# Patient Record
Sex: Female | Born: 2000 | Race: White | Hispanic: No | Marital: Single | Smoking: Never smoker
Health system: Southern US, Community
[De-identification: ages and names within clinical notes are randomized; demographics above are authoritative.]

---
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
## 2019-01-04 DIAGNOSIS — M9904 Segmental and somatic dysfunction of sacral region: Secondary | ICD-10-CM | POA: Diagnosis not present

## 2019-01-04 DIAGNOSIS — M9905 Segmental and somatic dysfunction of pelvic region: Secondary | ICD-10-CM | POA: Diagnosis not present

## 2019-01-04 DIAGNOSIS — M9903 Segmental and somatic dysfunction of lumbar region: Secondary | ICD-10-CM | POA: Diagnosis not present

## 2019-01-04 DIAGNOSIS — M4120 Other idiopathic scoliosis, site unspecified: Secondary | ICD-10-CM | POA: Diagnosis not present

## 2019-01-11 DIAGNOSIS — M4120 Other idiopathic scoliosis, site unspecified: Secondary | ICD-10-CM | POA: Diagnosis not present

## 2019-01-11 DIAGNOSIS — M9905 Segmental and somatic dysfunction of pelvic region: Secondary | ICD-10-CM | POA: Diagnosis not present

## 2019-01-11 DIAGNOSIS — M9904 Segmental and somatic dysfunction of sacral region: Secondary | ICD-10-CM | POA: Diagnosis not present

## 2019-01-11 DIAGNOSIS — M9903 Segmental and somatic dysfunction of lumbar region: Secondary | ICD-10-CM | POA: Diagnosis not present

## 2019-02-17 DIAGNOSIS — L700 Acne vulgaris: Secondary | ICD-10-CM | POA: Diagnosis not present

## 2019-02-17 DIAGNOSIS — L738 Other specified follicular disorders: Secondary | ICD-10-CM | POA: Diagnosis not present

## 2019-02-17 DIAGNOSIS — L219 Seborrheic dermatitis, unspecified: Secondary | ICD-10-CM | POA: Diagnosis not present

## 2019-03-03 DIAGNOSIS — M545 Low back pain: Secondary | ICD-10-CM | POA: Diagnosis not present

## 2019-03-03 DIAGNOSIS — M4186 Other forms of scoliosis, lumbar region: Secondary | ICD-10-CM | POA: Diagnosis not present

## 2019-03-03 DIAGNOSIS — M9902 Segmental and somatic dysfunction of thoracic region: Secondary | ICD-10-CM | POA: Diagnosis not present

## 2019-03-03 DIAGNOSIS — M546 Pain in thoracic spine: Secondary | ICD-10-CM | POA: Diagnosis not present

## 2019-03-03 DIAGNOSIS — M461 Sacroiliitis, not elsewhere classified: Secondary | ICD-10-CM | POA: Diagnosis not present

## 2019-03-03 DIAGNOSIS — M542 Cervicalgia: Secondary | ICD-10-CM | POA: Diagnosis not present

## 2019-03-06 DIAGNOSIS — M461 Sacroiliitis, not elsewhere classified: Secondary | ICD-10-CM | POA: Diagnosis not present

## 2019-03-06 DIAGNOSIS — M542 Cervicalgia: Secondary | ICD-10-CM | POA: Diagnosis not present

## 2019-03-06 DIAGNOSIS — M545 Low back pain: Secondary | ICD-10-CM | POA: Diagnosis not present

## 2019-03-06 DIAGNOSIS — M9902 Segmental and somatic dysfunction of thoracic region: Secondary | ICD-10-CM | POA: Diagnosis not present

## 2019-03-06 DIAGNOSIS — M546 Pain in thoracic spine: Secondary | ICD-10-CM | POA: Diagnosis not present

## 2019-03-06 DIAGNOSIS — M4186 Other forms of scoliosis, lumbar region: Secondary | ICD-10-CM | POA: Diagnosis not present

## 2019-03-07 DIAGNOSIS — M4186 Other forms of scoliosis, lumbar region: Secondary | ICD-10-CM | POA: Diagnosis not present

## 2019-03-07 DIAGNOSIS — M546 Pain in thoracic spine: Secondary | ICD-10-CM | POA: Diagnosis not present

## 2019-03-07 DIAGNOSIS — M542 Cervicalgia: Secondary | ICD-10-CM | POA: Diagnosis not present

## 2019-03-07 DIAGNOSIS — M461 Sacroiliitis, not elsewhere classified: Secondary | ICD-10-CM | POA: Diagnosis not present

## 2019-03-07 DIAGNOSIS — M9902 Segmental and somatic dysfunction of thoracic region: Secondary | ICD-10-CM | POA: Diagnosis not present

## 2019-03-07 DIAGNOSIS — M545 Low back pain: Secondary | ICD-10-CM | POA: Diagnosis not present

## 2019-03-08 DIAGNOSIS — M9902 Segmental and somatic dysfunction of thoracic region: Secondary | ICD-10-CM | POA: Diagnosis not present

## 2019-03-08 DIAGNOSIS — M4186 Other forms of scoliosis, lumbar region: Secondary | ICD-10-CM | POA: Diagnosis not present

## 2019-03-08 DIAGNOSIS — M546 Pain in thoracic spine: Secondary | ICD-10-CM | POA: Diagnosis not present

## 2019-03-08 DIAGNOSIS — M545 Low back pain: Secondary | ICD-10-CM | POA: Diagnosis not present

## 2019-03-08 DIAGNOSIS — M542 Cervicalgia: Secondary | ICD-10-CM | POA: Diagnosis not present

## 2019-03-08 DIAGNOSIS — M461 Sacroiliitis, not elsewhere classified: Secondary | ICD-10-CM | POA: Diagnosis not present

## 2019-03-13 DIAGNOSIS — M461 Sacroiliitis, not elsewhere classified: Secondary | ICD-10-CM | POA: Diagnosis not present

## 2019-03-13 DIAGNOSIS — M542 Cervicalgia: Secondary | ICD-10-CM | POA: Diagnosis not present

## 2019-03-13 DIAGNOSIS — M546 Pain in thoracic spine: Secondary | ICD-10-CM | POA: Diagnosis not present

## 2019-03-13 DIAGNOSIS — M9902 Segmental and somatic dysfunction of thoracic region: Secondary | ICD-10-CM | POA: Diagnosis not present

## 2019-03-13 DIAGNOSIS — M545 Low back pain: Secondary | ICD-10-CM | POA: Diagnosis not present

## 2019-03-13 DIAGNOSIS — M4186 Other forms of scoliosis, lumbar region: Secondary | ICD-10-CM | POA: Diagnosis not present

## 2019-03-14 DIAGNOSIS — R5383 Other fatigue: Secondary | ICD-10-CM | POA: Diagnosis not present

## 2019-03-14 DIAGNOSIS — U071 COVID-19: Secondary | ICD-10-CM | POA: Diagnosis not present

## 2019-03-20 DIAGNOSIS — M546 Pain in thoracic spine: Secondary | ICD-10-CM | POA: Diagnosis not present

## 2019-03-20 DIAGNOSIS — M545 Low back pain: Secondary | ICD-10-CM | POA: Diagnosis not present

## 2019-03-20 DIAGNOSIS — M4186 Other forms of scoliosis, lumbar region: Secondary | ICD-10-CM | POA: Diagnosis not present

## 2019-03-20 DIAGNOSIS — M461 Sacroiliitis, not elsewhere classified: Secondary | ICD-10-CM | POA: Diagnosis not present

## 2019-03-20 DIAGNOSIS — M542 Cervicalgia: Secondary | ICD-10-CM | POA: Diagnosis not present

## 2019-03-20 DIAGNOSIS — M9902 Segmental and somatic dysfunction of thoracic region: Secondary | ICD-10-CM | POA: Diagnosis not present

## 2019-03-21 DIAGNOSIS — M4186 Other forms of scoliosis, lumbar region: Secondary | ICD-10-CM | POA: Diagnosis not present

## 2019-03-21 DIAGNOSIS — M461 Sacroiliitis, not elsewhere classified: Secondary | ICD-10-CM | POA: Diagnosis not present

## 2019-03-21 DIAGNOSIS — M545 Low back pain: Secondary | ICD-10-CM | POA: Diagnosis not present

## 2019-03-21 DIAGNOSIS — M542 Cervicalgia: Secondary | ICD-10-CM | POA: Diagnosis not present

## 2019-03-21 DIAGNOSIS — M546 Pain in thoracic spine: Secondary | ICD-10-CM | POA: Diagnosis not present

## 2019-03-21 DIAGNOSIS — M9902 Segmental and somatic dysfunction of thoracic region: Secondary | ICD-10-CM | POA: Diagnosis not present

## 2019-03-22 DIAGNOSIS — M542 Cervicalgia: Secondary | ICD-10-CM | POA: Diagnosis not present

## 2019-03-22 DIAGNOSIS — M4186 Other forms of scoliosis, lumbar region: Secondary | ICD-10-CM | POA: Diagnosis not present

## 2019-03-22 DIAGNOSIS — M9902 Segmental and somatic dysfunction of thoracic region: Secondary | ICD-10-CM | POA: Diagnosis not present

## 2019-03-22 DIAGNOSIS — M546 Pain in thoracic spine: Secondary | ICD-10-CM | POA: Diagnosis not present

## 2019-03-22 DIAGNOSIS — M461 Sacroiliitis, not elsewhere classified: Secondary | ICD-10-CM | POA: Diagnosis not present

## 2019-03-22 DIAGNOSIS — M545 Low back pain: Secondary | ICD-10-CM | POA: Diagnosis not present

## 2019-03-27 DIAGNOSIS — M546 Pain in thoracic spine: Secondary | ICD-10-CM | POA: Diagnosis not present

## 2019-03-27 DIAGNOSIS — M461 Sacroiliitis, not elsewhere classified: Secondary | ICD-10-CM | POA: Diagnosis not present

## 2019-03-27 DIAGNOSIS — M4186 Other forms of scoliosis, lumbar region: Secondary | ICD-10-CM | POA: Diagnosis not present

## 2019-03-27 DIAGNOSIS — M542 Cervicalgia: Secondary | ICD-10-CM | POA: Diagnosis not present

## 2019-03-27 DIAGNOSIS — M545 Low back pain: Secondary | ICD-10-CM | POA: Diagnosis not present

## 2019-03-27 DIAGNOSIS — M9902 Segmental and somatic dysfunction of thoracic region: Secondary | ICD-10-CM | POA: Diagnosis not present

## 2019-03-28 DIAGNOSIS — M546 Pain in thoracic spine: Secondary | ICD-10-CM | POA: Diagnosis not present

## 2019-03-28 DIAGNOSIS — M9902 Segmental and somatic dysfunction of thoracic region: Secondary | ICD-10-CM | POA: Diagnosis not present

## 2019-03-28 DIAGNOSIS — M4186 Other forms of scoliosis, lumbar region: Secondary | ICD-10-CM | POA: Diagnosis not present

## 2019-03-28 DIAGNOSIS — M545 Low back pain: Secondary | ICD-10-CM | POA: Diagnosis not present

## 2019-03-28 DIAGNOSIS — M461 Sacroiliitis, not elsewhere classified: Secondary | ICD-10-CM | POA: Diagnosis not present

## 2019-03-28 DIAGNOSIS — M542 Cervicalgia: Secondary | ICD-10-CM | POA: Diagnosis not present

## 2019-04-03 DIAGNOSIS — M542 Cervicalgia: Secondary | ICD-10-CM | POA: Diagnosis not present

## 2019-04-03 DIAGNOSIS — M9902 Segmental and somatic dysfunction of thoracic region: Secondary | ICD-10-CM | POA: Diagnosis not present

## 2019-04-03 DIAGNOSIS — M545 Low back pain: Secondary | ICD-10-CM | POA: Diagnosis not present

## 2019-04-03 DIAGNOSIS — M546 Pain in thoracic spine: Secondary | ICD-10-CM | POA: Diagnosis not present

## 2019-04-03 DIAGNOSIS — M4186 Other forms of scoliosis, lumbar region: Secondary | ICD-10-CM | POA: Diagnosis not present

## 2019-04-03 DIAGNOSIS — M461 Sacroiliitis, not elsewhere classified: Secondary | ICD-10-CM | POA: Diagnosis not present

## 2019-04-04 DIAGNOSIS — M9902 Segmental and somatic dysfunction of thoracic region: Secondary | ICD-10-CM | POA: Diagnosis not present

## 2019-04-04 DIAGNOSIS — M4186 Other forms of scoliosis, lumbar region: Secondary | ICD-10-CM | POA: Diagnosis not present

## 2019-04-04 DIAGNOSIS — M546 Pain in thoracic spine: Secondary | ICD-10-CM | POA: Diagnosis not present

## 2019-04-04 DIAGNOSIS — M542 Cervicalgia: Secondary | ICD-10-CM | POA: Diagnosis not present

## 2019-04-04 DIAGNOSIS — M545 Low back pain: Secondary | ICD-10-CM | POA: Diagnosis not present

## 2019-04-04 DIAGNOSIS — M461 Sacroiliitis, not elsewhere classified: Secondary | ICD-10-CM | POA: Diagnosis not present

## 2019-04-05 DIAGNOSIS — M545 Low back pain: Secondary | ICD-10-CM | POA: Diagnosis not present

## 2019-04-05 DIAGNOSIS — M9902 Segmental and somatic dysfunction of thoracic region: Secondary | ICD-10-CM | POA: Diagnosis not present

## 2019-04-05 DIAGNOSIS — M542 Cervicalgia: Secondary | ICD-10-CM | POA: Diagnosis not present

## 2019-04-05 DIAGNOSIS — M461 Sacroiliitis, not elsewhere classified: Secondary | ICD-10-CM | POA: Diagnosis not present

## 2019-04-05 DIAGNOSIS — M4186 Other forms of scoliosis, lumbar region: Secondary | ICD-10-CM | POA: Diagnosis not present

## 2019-04-05 DIAGNOSIS — M546 Pain in thoracic spine: Secondary | ICD-10-CM | POA: Diagnosis not present

## 2019-04-10 DIAGNOSIS — M4186 Other forms of scoliosis, lumbar region: Secondary | ICD-10-CM | POA: Diagnosis not present

## 2019-04-10 DIAGNOSIS — M546 Pain in thoracic spine: Secondary | ICD-10-CM | POA: Diagnosis not present

## 2019-04-10 DIAGNOSIS — M545 Low back pain: Secondary | ICD-10-CM | POA: Diagnosis not present

## 2019-04-10 DIAGNOSIS — M461 Sacroiliitis, not elsewhere classified: Secondary | ICD-10-CM | POA: Diagnosis not present

## 2019-04-10 DIAGNOSIS — M9902 Segmental and somatic dysfunction of thoracic region: Secondary | ICD-10-CM | POA: Diagnosis not present

## 2019-04-10 DIAGNOSIS — M542 Cervicalgia: Secondary | ICD-10-CM | POA: Diagnosis not present

## 2019-04-11 DIAGNOSIS — M545 Low back pain: Secondary | ICD-10-CM | POA: Diagnosis not present

## 2019-04-11 DIAGNOSIS — M461 Sacroiliitis, not elsewhere classified: Secondary | ICD-10-CM | POA: Diagnosis not present

## 2019-04-11 DIAGNOSIS — M4186 Other forms of scoliosis, lumbar region: Secondary | ICD-10-CM | POA: Diagnosis not present

## 2019-04-11 DIAGNOSIS — M9902 Segmental and somatic dysfunction of thoracic region: Secondary | ICD-10-CM | POA: Diagnosis not present

## 2019-04-11 DIAGNOSIS — M546 Pain in thoracic spine: Secondary | ICD-10-CM | POA: Diagnosis not present

## 2019-04-11 DIAGNOSIS — M542 Cervicalgia: Secondary | ICD-10-CM | POA: Diagnosis not present

## 2019-04-24 DIAGNOSIS — M4186 Other forms of scoliosis, lumbar region: Secondary | ICD-10-CM | POA: Diagnosis not present

## 2019-04-24 DIAGNOSIS — M542 Cervicalgia: Secondary | ICD-10-CM | POA: Diagnosis not present

## 2019-04-24 DIAGNOSIS — M461 Sacroiliitis, not elsewhere classified: Secondary | ICD-10-CM | POA: Diagnosis not present

## 2019-04-24 DIAGNOSIS — M545 Low back pain: Secondary | ICD-10-CM | POA: Diagnosis not present

## 2019-04-24 DIAGNOSIS — M9902 Segmental and somatic dysfunction of thoracic region: Secondary | ICD-10-CM | POA: Diagnosis not present

## 2019-04-24 DIAGNOSIS — M546 Pain in thoracic spine: Secondary | ICD-10-CM | POA: Diagnosis not present

## 2019-04-25 DIAGNOSIS — M542 Cervicalgia: Secondary | ICD-10-CM | POA: Diagnosis not present

## 2019-04-25 DIAGNOSIS — M9902 Segmental and somatic dysfunction of thoracic region: Secondary | ICD-10-CM | POA: Diagnosis not present

## 2019-04-25 DIAGNOSIS — M546 Pain in thoracic spine: Secondary | ICD-10-CM | POA: Diagnosis not present

## 2019-04-25 DIAGNOSIS — M461 Sacroiliitis, not elsewhere classified: Secondary | ICD-10-CM | POA: Diagnosis not present

## 2019-04-25 DIAGNOSIS — M545 Low back pain: Secondary | ICD-10-CM | POA: Diagnosis not present

## 2019-04-25 DIAGNOSIS — M4186 Other forms of scoliosis, lumbar region: Secondary | ICD-10-CM | POA: Diagnosis not present

## 2019-04-26 DIAGNOSIS — M545 Low back pain: Secondary | ICD-10-CM | POA: Diagnosis not present

## 2019-04-26 DIAGNOSIS — M546 Pain in thoracic spine: Secondary | ICD-10-CM | POA: Diagnosis not present

## 2019-04-26 DIAGNOSIS — M542 Cervicalgia: Secondary | ICD-10-CM | POA: Diagnosis not present

## 2019-04-26 DIAGNOSIS — M461 Sacroiliitis, not elsewhere classified: Secondary | ICD-10-CM | POA: Diagnosis not present

## 2019-04-26 DIAGNOSIS — M9902 Segmental and somatic dysfunction of thoracic region: Secondary | ICD-10-CM | POA: Diagnosis not present

## 2019-04-26 DIAGNOSIS — M4186 Other forms of scoliosis, lumbar region: Secondary | ICD-10-CM | POA: Diagnosis not present

## 2019-05-01 DIAGNOSIS — M542 Cervicalgia: Secondary | ICD-10-CM | POA: Diagnosis not present

## 2019-05-01 DIAGNOSIS — M9902 Segmental and somatic dysfunction of thoracic region: Secondary | ICD-10-CM | POA: Diagnosis not present

## 2019-05-01 DIAGNOSIS — M546 Pain in thoracic spine: Secondary | ICD-10-CM | POA: Diagnosis not present

## 2019-05-01 DIAGNOSIS — M545 Low back pain: Secondary | ICD-10-CM | POA: Diagnosis not present

## 2019-05-01 DIAGNOSIS — M461 Sacroiliitis, not elsewhere classified: Secondary | ICD-10-CM | POA: Diagnosis not present

## 2019-05-01 DIAGNOSIS — M4186 Other forms of scoliosis, lumbar region: Secondary | ICD-10-CM | POA: Diagnosis not present

## 2019-05-02 DIAGNOSIS — M4186 Other forms of scoliosis, lumbar region: Secondary | ICD-10-CM | POA: Diagnosis not present

## 2019-05-02 DIAGNOSIS — M542 Cervicalgia: Secondary | ICD-10-CM | POA: Diagnosis not present

## 2019-05-02 DIAGNOSIS — M546 Pain in thoracic spine: Secondary | ICD-10-CM | POA: Diagnosis not present

## 2019-05-02 DIAGNOSIS — M9902 Segmental and somatic dysfunction of thoracic region: Secondary | ICD-10-CM | POA: Diagnosis not present

## 2019-05-02 DIAGNOSIS — M545 Low back pain: Secondary | ICD-10-CM | POA: Diagnosis not present

## 2019-05-02 DIAGNOSIS — M461 Sacroiliitis, not elsewhere classified: Secondary | ICD-10-CM | POA: Diagnosis not present

## 2019-05-03 DIAGNOSIS — M546 Pain in thoracic spine: Secondary | ICD-10-CM | POA: Diagnosis not present

## 2019-05-03 DIAGNOSIS — M545 Low back pain: Secondary | ICD-10-CM | POA: Diagnosis not present

## 2019-05-03 DIAGNOSIS — M4186 Other forms of scoliosis, lumbar region: Secondary | ICD-10-CM | POA: Diagnosis not present

## 2019-05-03 DIAGNOSIS — M461 Sacroiliitis, not elsewhere classified: Secondary | ICD-10-CM | POA: Diagnosis not present

## 2019-05-03 DIAGNOSIS — M542 Cervicalgia: Secondary | ICD-10-CM | POA: Diagnosis not present

## 2019-05-03 DIAGNOSIS — M9902 Segmental and somatic dysfunction of thoracic region: Secondary | ICD-10-CM | POA: Diagnosis not present

## 2019-05-22 DIAGNOSIS — M542 Cervicalgia: Secondary | ICD-10-CM | POA: Diagnosis not present

## 2019-05-22 DIAGNOSIS — M461 Sacroiliitis, not elsewhere classified: Secondary | ICD-10-CM | POA: Diagnosis not present

## 2019-05-22 DIAGNOSIS — M546 Pain in thoracic spine: Secondary | ICD-10-CM | POA: Diagnosis not present

## 2019-05-22 DIAGNOSIS — M4186 Other forms of scoliosis, lumbar region: Secondary | ICD-10-CM | POA: Diagnosis not present

## 2019-05-22 DIAGNOSIS — M545 Low back pain: Secondary | ICD-10-CM | POA: Diagnosis not present

## 2019-05-22 DIAGNOSIS — M9902 Segmental and somatic dysfunction of thoracic region: Secondary | ICD-10-CM | POA: Diagnosis not present

## 2019-06-05 DIAGNOSIS — M546 Pain in thoracic spine: Secondary | ICD-10-CM | POA: Diagnosis not present

## 2019-06-05 DIAGNOSIS — M4186 Other forms of scoliosis, lumbar region: Secondary | ICD-10-CM | POA: Diagnosis not present

## 2019-06-05 DIAGNOSIS — M545 Low back pain: Secondary | ICD-10-CM | POA: Diagnosis not present

## 2019-06-05 DIAGNOSIS — M461 Sacroiliitis, not elsewhere classified: Secondary | ICD-10-CM | POA: Diagnosis not present

## 2019-06-05 DIAGNOSIS — M542 Cervicalgia: Secondary | ICD-10-CM | POA: Diagnosis not present

## 2019-06-05 DIAGNOSIS — M9902 Segmental and somatic dysfunction of thoracic region: Secondary | ICD-10-CM | POA: Diagnosis not present

## 2019-06-08 DIAGNOSIS — L700 Acne vulgaris: Secondary | ICD-10-CM | POA: Diagnosis not present

## 2019-06-08 DIAGNOSIS — L219 Seborrheic dermatitis, unspecified: Secondary | ICD-10-CM | POA: Diagnosis not present

## 2019-08-18 DIAGNOSIS — M67431 Ganglion, right wrist: Secondary | ICD-10-CM | POA: Diagnosis not present

## 2019-08-18 DIAGNOSIS — M25531 Pain in right wrist: Secondary | ICD-10-CM | POA: Diagnosis not present

## 2019-10-23 DIAGNOSIS — M25531 Pain in right wrist: Secondary | ICD-10-CM | POA: Diagnosis not present

## 2019-10-23 DIAGNOSIS — M67431 Ganglion, right wrist: Secondary | ICD-10-CM | POA: Diagnosis not present

## 2019-10-30 DIAGNOSIS — L814 Other melanin hyperpigmentation: Secondary | ICD-10-CM | POA: Diagnosis not present

## 2019-10-30 DIAGNOSIS — Z1283 Encounter for screening for malignant neoplasm of skin: Secondary | ICD-10-CM | POA: Diagnosis not present

## 2019-10-30 DIAGNOSIS — D229 Melanocytic nevi, unspecified: Secondary | ICD-10-CM | POA: Diagnosis not present

## 2019-10-30 DIAGNOSIS — L739 Follicular disorder, unspecified: Secondary | ICD-10-CM | POA: Diagnosis not present

## 2019-11-07 DIAGNOSIS — M4186 Other forms of scoliosis, lumbar region: Secondary | ICD-10-CM | POA: Diagnosis not present

## 2019-11-07 DIAGNOSIS — M9902 Segmental and somatic dysfunction of thoracic region: Secondary | ICD-10-CM | POA: Diagnosis not present

## 2019-11-07 DIAGNOSIS — M461 Sacroiliitis, not elsewhere classified: Secondary | ICD-10-CM | POA: Diagnosis not present

## 2019-11-07 DIAGNOSIS — M542 Cervicalgia: Secondary | ICD-10-CM | POA: Diagnosis not present

## 2019-11-07 DIAGNOSIS — M546 Pain in thoracic spine: Secondary | ICD-10-CM | POA: Diagnosis not present

## 2019-11-07 DIAGNOSIS — M545 Low back pain: Secondary | ICD-10-CM | POA: Diagnosis not present

## 2019-11-09 DIAGNOSIS — M4186 Other forms of scoliosis, lumbar region: Secondary | ICD-10-CM | POA: Diagnosis not present

## 2019-11-09 DIAGNOSIS — M461 Sacroiliitis, not elsewhere classified: Secondary | ICD-10-CM | POA: Diagnosis not present

## 2019-11-09 DIAGNOSIS — M545 Low back pain: Secondary | ICD-10-CM | POA: Diagnosis not present

## 2019-11-09 DIAGNOSIS — M9902 Segmental and somatic dysfunction of thoracic region: Secondary | ICD-10-CM | POA: Diagnosis not present

## 2019-11-09 DIAGNOSIS — M542 Cervicalgia: Secondary | ICD-10-CM | POA: Diagnosis not present

## 2019-11-09 DIAGNOSIS — M546 Pain in thoracic spine: Secondary | ICD-10-CM | POA: Diagnosis not present

## 2019-11-13 DIAGNOSIS — M545 Low back pain: Secondary | ICD-10-CM | POA: Diagnosis not present

## 2019-11-13 DIAGNOSIS — M9902 Segmental and somatic dysfunction of thoracic region: Secondary | ICD-10-CM | POA: Diagnosis not present

## 2019-11-13 DIAGNOSIS — M542 Cervicalgia: Secondary | ICD-10-CM | POA: Diagnosis not present

## 2019-11-13 DIAGNOSIS — M4186 Other forms of scoliosis, lumbar region: Secondary | ICD-10-CM | POA: Diagnosis not present

## 2019-11-13 DIAGNOSIS — M461 Sacroiliitis, not elsewhere classified: Secondary | ICD-10-CM | POA: Diagnosis not present

## 2019-11-13 DIAGNOSIS — M546 Pain in thoracic spine: Secondary | ICD-10-CM | POA: Diagnosis not present

## 2019-11-16 DIAGNOSIS — M9902 Segmental and somatic dysfunction of thoracic region: Secondary | ICD-10-CM | POA: Diagnosis not present

## 2019-11-16 DIAGNOSIS — M461 Sacroiliitis, not elsewhere classified: Secondary | ICD-10-CM | POA: Diagnosis not present

## 2019-11-16 DIAGNOSIS — M4186 Other forms of scoliosis, lumbar region: Secondary | ICD-10-CM | POA: Diagnosis not present

## 2019-11-16 DIAGNOSIS — M542 Cervicalgia: Secondary | ICD-10-CM | POA: Diagnosis not present

## 2019-11-16 DIAGNOSIS — M546 Pain in thoracic spine: Secondary | ICD-10-CM | POA: Diagnosis not present

## 2019-11-16 DIAGNOSIS — M545 Low back pain: Secondary | ICD-10-CM | POA: Diagnosis not present

## 2019-11-24 DIAGNOSIS — M546 Pain in thoracic spine: Secondary | ICD-10-CM | POA: Diagnosis not present

## 2019-11-24 DIAGNOSIS — M545 Low back pain: Secondary | ICD-10-CM | POA: Diagnosis not present

## 2019-11-24 DIAGNOSIS — M461 Sacroiliitis, not elsewhere classified: Secondary | ICD-10-CM | POA: Diagnosis not present

## 2019-11-24 DIAGNOSIS — M4186 Other forms of scoliosis, lumbar region: Secondary | ICD-10-CM | POA: Diagnosis not present

## 2019-11-24 DIAGNOSIS — M542 Cervicalgia: Secondary | ICD-10-CM | POA: Diagnosis not present

## 2019-11-24 DIAGNOSIS — M9902 Segmental and somatic dysfunction of thoracic region: Secondary | ICD-10-CM | POA: Diagnosis not present

## 2019-11-27 DIAGNOSIS — M25475 Effusion, left foot: Secondary | ICD-10-CM | POA: Diagnosis not present

## 2019-11-27 DIAGNOSIS — M25474 Effusion, right foot: Secondary | ICD-10-CM | POA: Diagnosis not present

## 2019-11-27 DIAGNOSIS — M722 Plantar fascial fibromatosis: Secondary | ICD-10-CM | POA: Diagnosis not present

## 2019-12-01 DIAGNOSIS — M25531 Pain in right wrist: Secondary | ICD-10-CM | POA: Diagnosis not present

## 2019-12-01 DIAGNOSIS — M67431 Ganglion, right wrist: Secondary | ICD-10-CM | POA: Diagnosis not present

## 2019-12-04 DIAGNOSIS — M545 Low back pain: Secondary | ICD-10-CM | POA: Diagnosis not present

## 2019-12-04 DIAGNOSIS — M461 Sacroiliitis, not elsewhere classified: Secondary | ICD-10-CM | POA: Diagnosis not present

## 2019-12-04 DIAGNOSIS — M9902 Segmental and somatic dysfunction of thoracic region: Secondary | ICD-10-CM | POA: Diagnosis not present

## 2019-12-04 DIAGNOSIS — M546 Pain in thoracic spine: Secondary | ICD-10-CM | POA: Diagnosis not present

## 2019-12-04 DIAGNOSIS — M542 Cervicalgia: Secondary | ICD-10-CM | POA: Diagnosis not present

## 2019-12-04 DIAGNOSIS — M4186 Other forms of scoliosis, lumbar region: Secondary | ICD-10-CM | POA: Diagnosis not present

## 2019-12-05 DIAGNOSIS — M722 Plantar fascial fibromatosis: Secondary | ICD-10-CM | POA: Diagnosis not present

## 2019-12-05 DIAGNOSIS — M25475 Effusion, left foot: Secondary | ICD-10-CM | POA: Diagnosis not present

## 2019-12-05 DIAGNOSIS — M25474 Effusion, right foot: Secondary | ICD-10-CM | POA: Diagnosis not present

## 2019-12-11 DIAGNOSIS — M461 Sacroiliitis, not elsewhere classified: Secondary | ICD-10-CM | POA: Diagnosis not present

## 2019-12-11 DIAGNOSIS — M9902 Segmental and somatic dysfunction of thoracic region: Secondary | ICD-10-CM | POA: Diagnosis not present

## 2019-12-11 DIAGNOSIS — M542 Cervicalgia: Secondary | ICD-10-CM | POA: Diagnosis not present

## 2019-12-11 DIAGNOSIS — M546 Pain in thoracic spine: Secondary | ICD-10-CM | POA: Diagnosis not present

## 2019-12-11 DIAGNOSIS — M545 Low back pain: Secondary | ICD-10-CM | POA: Diagnosis not present

## 2019-12-11 DIAGNOSIS — M4186 Other forms of scoliosis, lumbar region: Secondary | ICD-10-CM | POA: Diagnosis not present

## 2020-01-04 DIAGNOSIS — M25475 Effusion, left foot: Secondary | ICD-10-CM | POA: Diagnosis not present

## 2020-01-04 DIAGNOSIS — M722 Plantar fascial fibromatosis: Secondary | ICD-10-CM | POA: Diagnosis not present

## 2020-01-04 DIAGNOSIS — M25474 Effusion, right foot: Secondary | ICD-10-CM | POA: Diagnosis not present

## 2020-01-23 DIAGNOSIS — M461 Sacroiliitis, not elsewhere classified: Secondary | ICD-10-CM | POA: Diagnosis not present

## 2020-01-23 DIAGNOSIS — M546 Pain in thoracic spine: Secondary | ICD-10-CM | POA: Diagnosis not present

## 2020-01-23 DIAGNOSIS — M542 Cervicalgia: Secondary | ICD-10-CM | POA: Diagnosis not present

## 2020-01-23 DIAGNOSIS — M9902 Segmental and somatic dysfunction of thoracic region: Secondary | ICD-10-CM | POA: Diagnosis not present

## 2020-01-23 DIAGNOSIS — M4186 Other forms of scoliosis, lumbar region: Secondary | ICD-10-CM | POA: Diagnosis not present

## 2020-01-23 DIAGNOSIS — M545 Low back pain: Secondary | ICD-10-CM | POA: Diagnosis not present

## 2020-01-30 DIAGNOSIS — M9902 Segmental and somatic dysfunction of thoracic region: Secondary | ICD-10-CM | POA: Diagnosis not present

## 2020-01-30 DIAGNOSIS — M545 Low back pain: Secondary | ICD-10-CM | POA: Diagnosis not present

## 2020-01-30 DIAGNOSIS — M4186 Other forms of scoliosis, lumbar region: Secondary | ICD-10-CM | POA: Diagnosis not present

## 2020-01-30 DIAGNOSIS — M461 Sacroiliitis, not elsewhere classified: Secondary | ICD-10-CM | POA: Diagnosis not present

## 2020-01-30 DIAGNOSIS — M542 Cervicalgia: Secondary | ICD-10-CM | POA: Diagnosis not present

## 2020-01-30 DIAGNOSIS — M546 Pain in thoracic spine: Secondary | ICD-10-CM | POA: Diagnosis not present

## 2020-02-01 DIAGNOSIS — M542 Cervicalgia: Secondary | ICD-10-CM | POA: Diagnosis not present

## 2020-02-01 DIAGNOSIS — M4186 Other forms of scoliosis, lumbar region: Secondary | ICD-10-CM | POA: Diagnosis not present

## 2020-02-01 DIAGNOSIS — M546 Pain in thoracic spine: Secondary | ICD-10-CM | POA: Diagnosis not present

## 2020-02-01 DIAGNOSIS — M545 Low back pain: Secondary | ICD-10-CM | POA: Diagnosis not present

## 2020-02-01 DIAGNOSIS — M461 Sacroiliitis, not elsewhere classified: Secondary | ICD-10-CM | POA: Diagnosis not present

## 2020-02-01 DIAGNOSIS — M9902 Segmental and somatic dysfunction of thoracic region: Secondary | ICD-10-CM | POA: Diagnosis not present

## 2020-02-12 DIAGNOSIS — Z0000 Encounter for general adult medical examination without abnormal findings: Secondary | ICD-10-CM | POA: Diagnosis not present

## 2020-02-12 DIAGNOSIS — F988 Other specified behavioral and emotional disorders with onset usually occurring in childhood and adolescence: Secondary | ICD-10-CM | POA: Diagnosis not present

## 2020-02-12 DIAGNOSIS — L74510 Primary focal hyperhidrosis, axilla: Secondary | ICD-10-CM | POA: Diagnosis not present

## 2020-02-12 DIAGNOSIS — F5101 Primary insomnia: Secondary | ICD-10-CM | POA: Diagnosis not present

## 2020-02-13 DIAGNOSIS — M722 Plantar fascial fibromatosis: Secondary | ICD-10-CM | POA: Diagnosis not present

## 2020-03-18 DIAGNOSIS — M542 Cervicalgia: Secondary | ICD-10-CM | POA: Diagnosis not present

## 2020-03-18 DIAGNOSIS — M546 Pain in thoracic spine: Secondary | ICD-10-CM | POA: Diagnosis not present

## 2020-03-18 DIAGNOSIS — M5459 Other low back pain: Secondary | ICD-10-CM | POA: Diagnosis not present

## 2020-03-18 DIAGNOSIS — M461 Sacroiliitis, not elsewhere classified: Secondary | ICD-10-CM | POA: Diagnosis not present

## 2020-03-18 DIAGNOSIS — M9902 Segmental and somatic dysfunction of thoracic region: Secondary | ICD-10-CM | POA: Diagnosis not present

## 2020-03-18 DIAGNOSIS — M4186 Other forms of scoliosis, lumbar region: Secondary | ICD-10-CM | POA: Diagnosis not present

## 2020-03-21 DIAGNOSIS — M542 Cervicalgia: Secondary | ICD-10-CM | POA: Diagnosis not present

## 2020-03-21 DIAGNOSIS — M546 Pain in thoracic spine: Secondary | ICD-10-CM | POA: Diagnosis not present

## 2020-03-21 DIAGNOSIS — M4186 Other forms of scoliosis, lumbar region: Secondary | ICD-10-CM | POA: Diagnosis not present

## 2020-03-21 DIAGNOSIS — M461 Sacroiliitis, not elsewhere classified: Secondary | ICD-10-CM | POA: Diagnosis not present

## 2020-03-21 DIAGNOSIS — M9902 Segmental and somatic dysfunction of thoracic region: Secondary | ICD-10-CM | POA: Diagnosis not present

## 2020-03-21 DIAGNOSIS — M5459 Other low back pain: Secondary | ICD-10-CM | POA: Diagnosis not present

## 2020-03-25 DIAGNOSIS — M4186 Other forms of scoliosis, lumbar region: Secondary | ICD-10-CM | POA: Diagnosis not present

## 2020-03-25 DIAGNOSIS — M5459 Other low back pain: Secondary | ICD-10-CM | POA: Diagnosis not present

## 2020-03-25 DIAGNOSIS — M542 Cervicalgia: Secondary | ICD-10-CM | POA: Diagnosis not present

## 2020-03-25 DIAGNOSIS — M461 Sacroiliitis, not elsewhere classified: Secondary | ICD-10-CM | POA: Diagnosis not present

## 2020-03-25 DIAGNOSIS — M546 Pain in thoracic spine: Secondary | ICD-10-CM | POA: Diagnosis not present

## 2020-03-25 DIAGNOSIS — M9902 Segmental and somatic dysfunction of thoracic region: Secondary | ICD-10-CM | POA: Diagnosis not present

## 2020-06-19 DIAGNOSIS — F988 Other specified behavioral and emotional disorders with onset usually occurring in childhood and adolescence: Secondary | ICD-10-CM | POA: Diagnosis not present

## 2020-06-19 DIAGNOSIS — E559 Vitamin D deficiency, unspecified: Secondary | ICD-10-CM | POA: Diagnosis not present

## 2020-06-19 DIAGNOSIS — F5101 Primary insomnia: Secondary | ICD-10-CM | POA: Diagnosis not present

## 2020-06-19 DIAGNOSIS — R3129 Other microscopic hematuria: Secondary | ICD-10-CM | POA: Diagnosis not present

## 2020-09-17 DIAGNOSIS — F5101 Primary insomnia: Secondary | ICD-10-CM | POA: Diagnosis not present

## 2020-09-17 DIAGNOSIS — F988 Other specified behavioral and emotional disorders with onset usually occurring in childhood and adolescence: Secondary | ICD-10-CM | POA: Diagnosis not present

## 2020-10-27 DIAGNOSIS — J0100 Acute maxillary sinusitis, unspecified: Secondary | ICD-10-CM | POA: Diagnosis not present
